# Patient Record
Sex: Female | Born: 1985 | Race: White | Hispanic: No | Smoking: Current every day smoker
Health system: Southern US, Community
[De-identification: ages and names within clinical notes are randomized; demographics above are authoritative.]

---
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
## 2004-02-18 ENCOUNTER — Inpatient Hospital Stay (HOSPITAL_COMMUNITY): Admission: AD | Admit: 2004-02-18 | Discharge: 2004-02-18 | Payer: Self-pay | Admitting: Obstetrics and Gynecology

## 2004-03-02 ENCOUNTER — Encounter (INDEPENDENT_AMBULATORY_CARE_PROVIDER_SITE_OTHER): Payer: Self-pay | Admitting: Specialist

## 2004-03-02 ENCOUNTER — Inpatient Hospital Stay (HOSPITAL_COMMUNITY): Admission: RE | Admit: 2004-03-02 | Discharge: 2004-03-05 | Payer: Self-pay | Admitting: Obstetrics and Gynecology

## 2004-04-09 ENCOUNTER — Other Ambulatory Visit: Admission: RE | Admit: 2004-04-09 | Discharge: 2004-04-09 | Payer: Self-pay | Admitting: Obstetrics and Gynecology

## 2005-08-02 ENCOUNTER — Other Ambulatory Visit: Admission: RE | Admit: 2005-08-02 | Discharge: 2005-08-02 | Payer: Self-pay | Admitting: Obstetrics and Gynecology

## 2005-11-21 ENCOUNTER — Inpatient Hospital Stay (HOSPITAL_COMMUNITY): Admission: AD | Admit: 2005-11-21 | Discharge: 2005-11-24 | Payer: Self-pay | Admitting: Obstetrics and Gynecology

## 2006-01-02 IMAGING — US US OB COMP +14 WK
1 series · 13 of 27 positions shown · non-contrast
Comparison: none

CLINICAL DATA: 37 week 4 day assigned gestational age by prior office ultrasound.  Breech presentation.  Reevaluate prior to version.  Evaluate fetal growth and amniotic fluid.

[Series 1: unknown · 0.35mm/px · 13 of 27 slices shown]
[im 2/27]
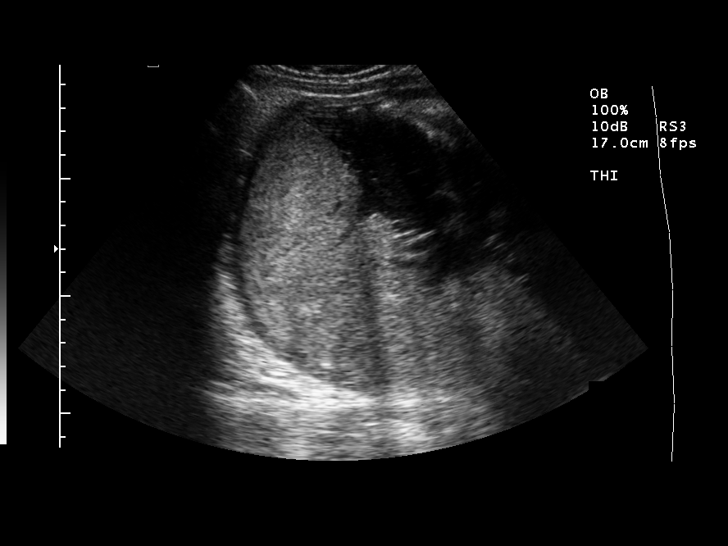
[im 4/27]
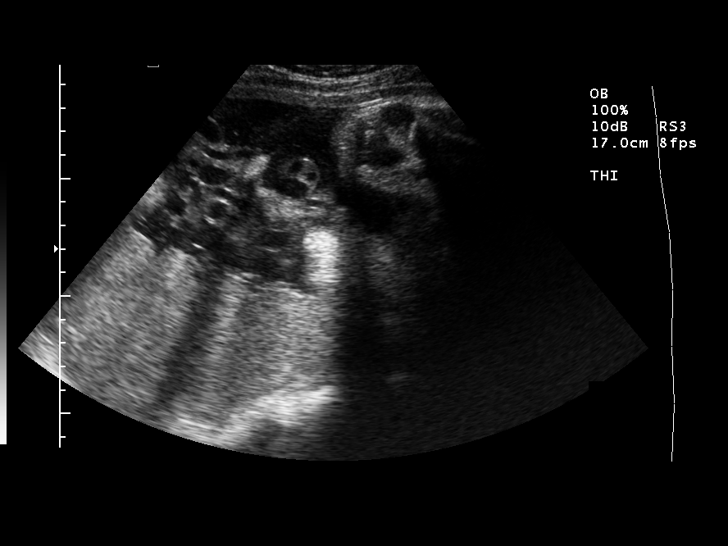
[im 6/27]
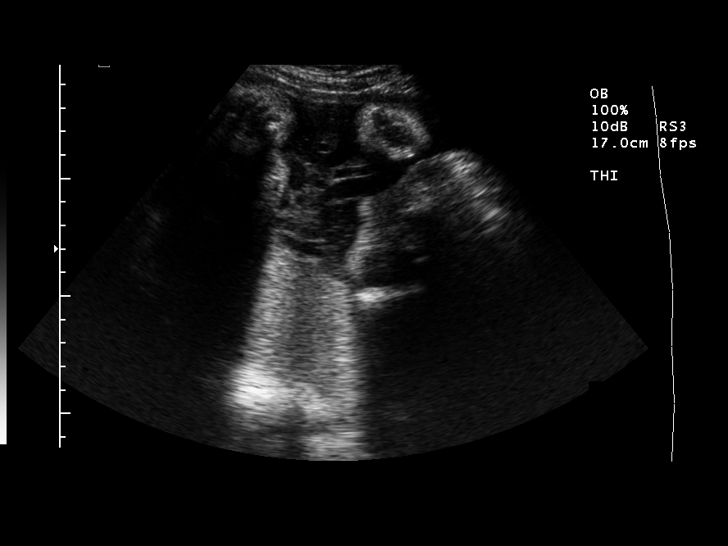
[im 8/27]
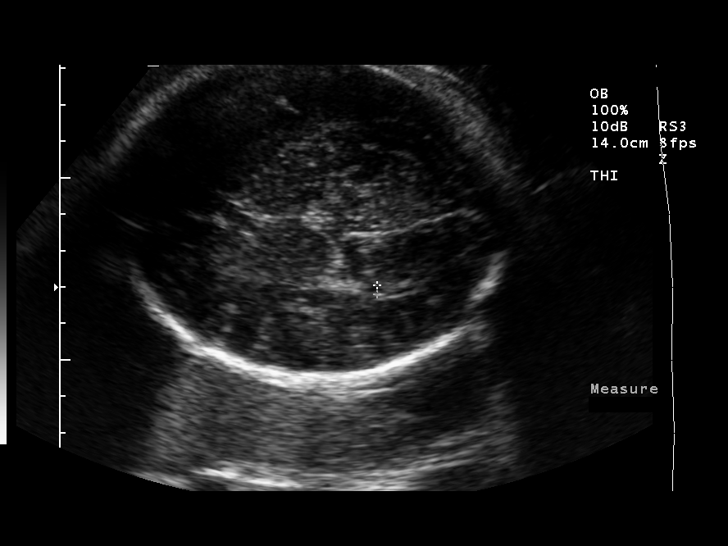
[im 10/27]
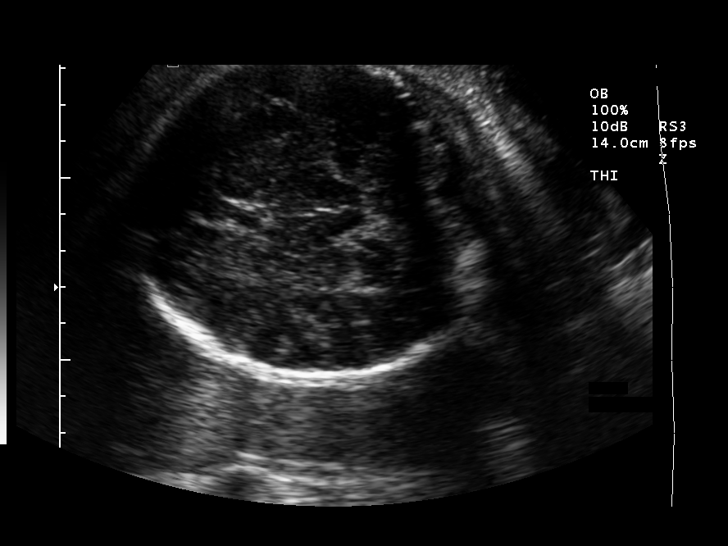
[im 12/27]
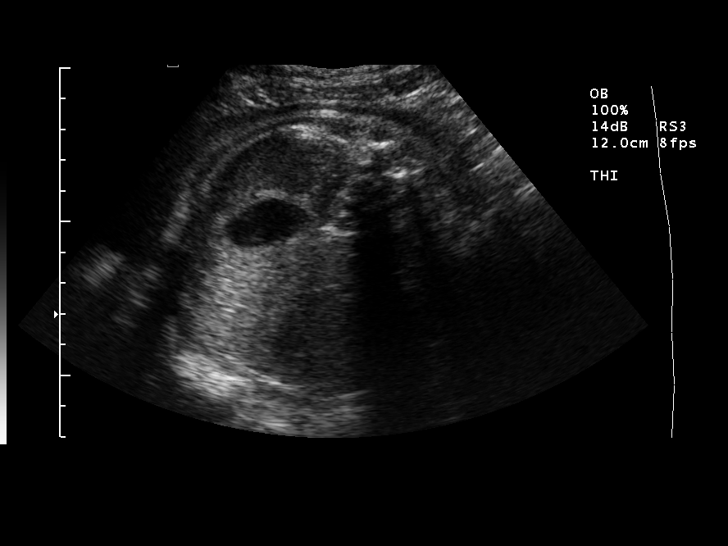
[im 14/27]
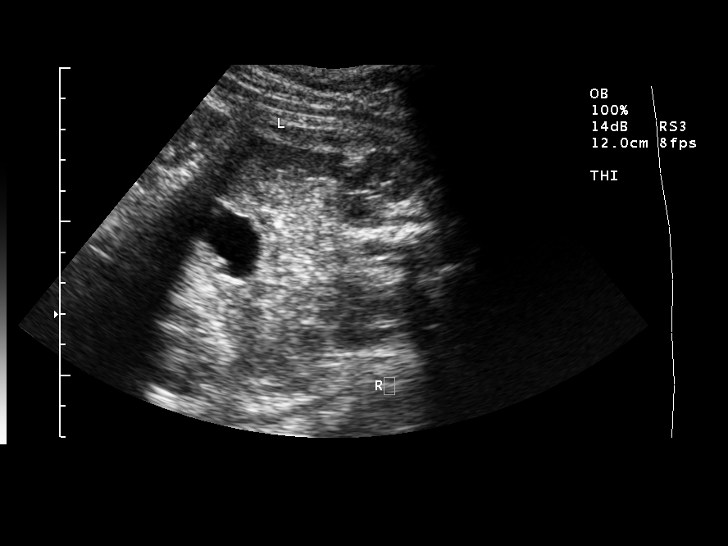
[im 16/27]
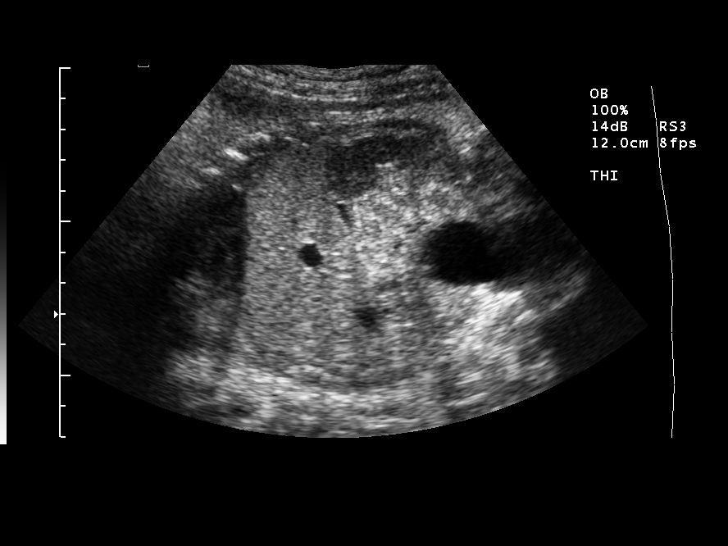
[im 18/27]
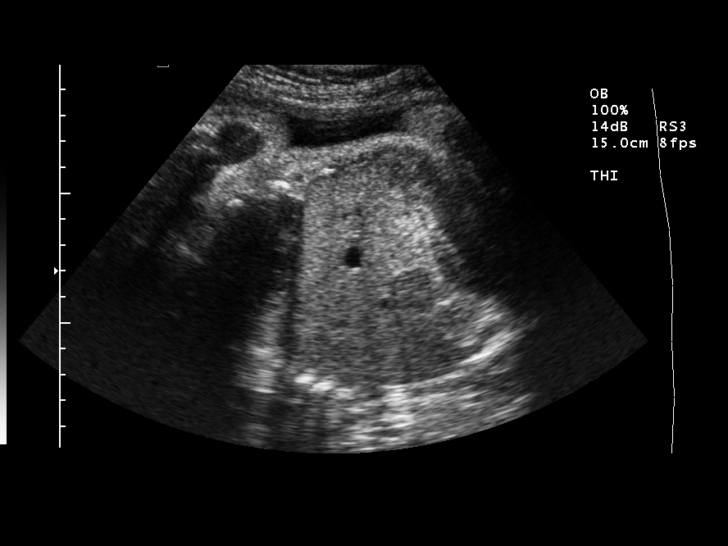
[im 20/27]
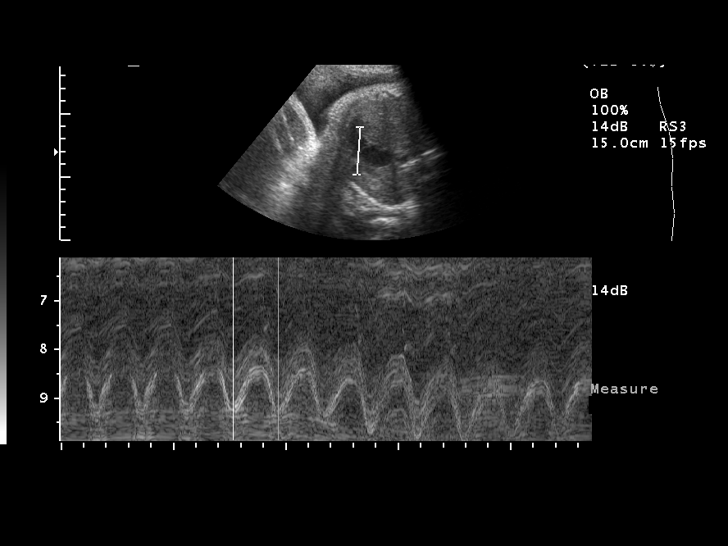
[im 22/27]
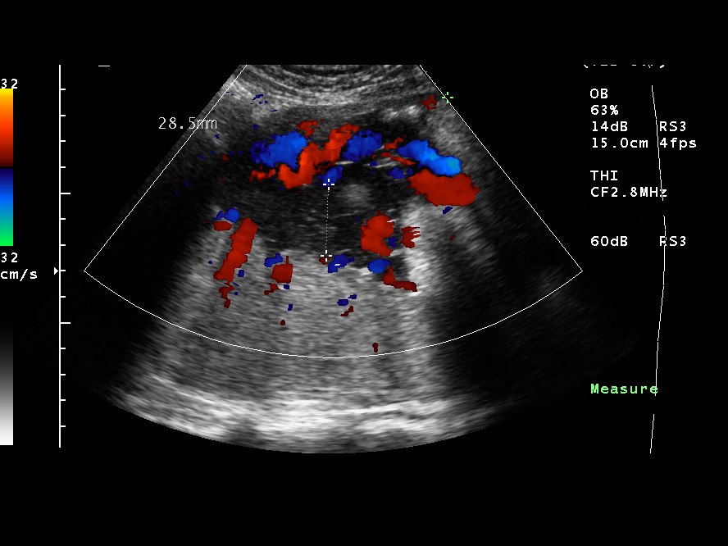
[im 24/27]
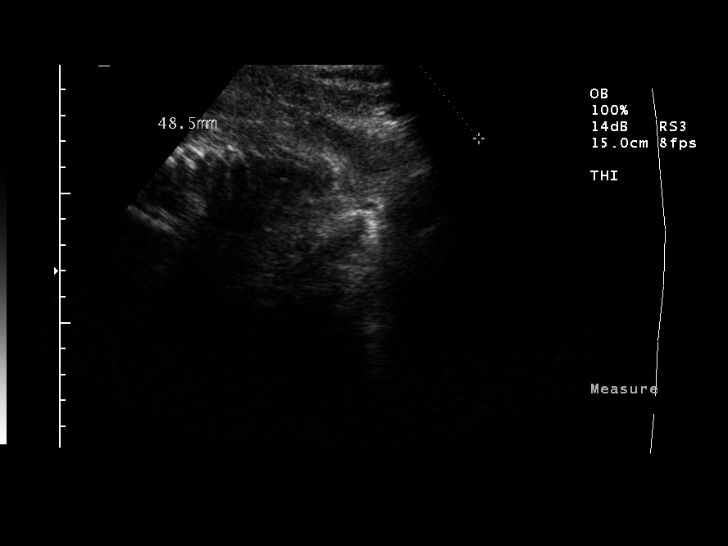
[im 26/27]
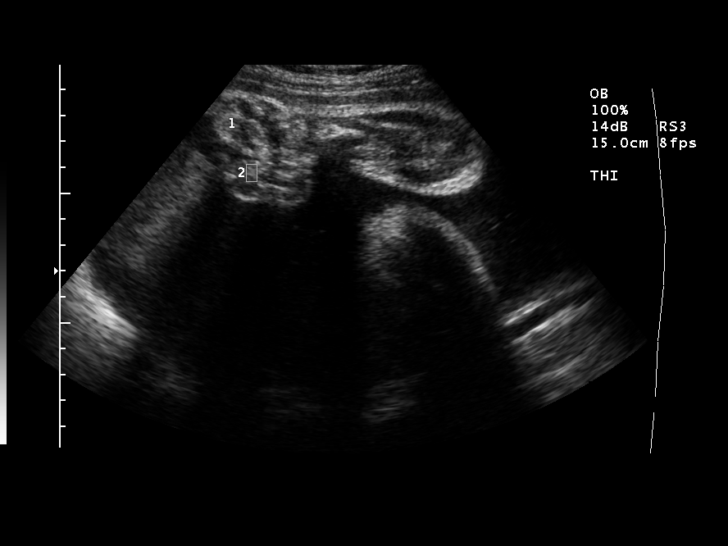

[13 of 27 positions shown; findings below may reference images not displayed]

OBSTETRICAL ULTRASOUND
 Number of Fetuses:  1  
 Heart Rate:  150
 Movement:  Yes
 Breathing:    No  
 Presentation:  Frank breech
 Placental Location:  Posterior
 Grade:  I
 Previa:  No
 Amniotic Fluid (Subjective):  Normal
 Amniotic Fluid (Objective):   13.4 cm AFI (5th -95th%ile =   7.3 ? 23.9 cm for 38 wks)

 FETAL BIOMETRY
 BPD:   8.9 cm   36 w 0 d
 HC:   33.4 cm   38 w 1 d
 AC:  34.1 cm   38 w 0 d
 FL:   7.3 cm   37 w 2 d

 MEAN GA:  37 w 3 d

 EFW:  2534 g (H) 50th ? 75th%ile (6846 ? 7093 g) For 38 wks

 FETAL ANATOMY
 Lateral Ventricles:    Visualized 
 Thalami/CSP:      Visualized 
 Posterior Fossa:  Not visualized 
 Nuchal Region:    N/A
 Spine:      Not visualized 
 4 Chamber Heart on Left:      Not visualized 
 Stomach on Left:      Visualized 
 3 Vessel Cord:    Visualized 
 Cord Insertion site:    Not visualized 
 Kidneys:  Visualized 
 Bladder:  Visualized 
 Extremities:      Not visualized 

 ADDITIONAL ANATOMY VISUALIZED:  Diaphragm

 Evaluation limited by:  Advanced gestational age and fetal position

 MATERNAL FINDINGS
 Cervix:   Not evaluated.  (> 37 wks)
IMPRESSION: Assigned gestational age is currently 37 weeks 4 days.  Appropriate fetal growth, with EFW at 50th ? 75th percentile.
 Normal amniotic fluid volume.  
 Fetus in frank breech presentation.  

 </u12:p>

## 2006-02-03 ENCOUNTER — Inpatient Hospital Stay (HOSPITAL_COMMUNITY): Admission: AD | Admit: 2006-02-03 | Discharge: 2006-02-06 | Payer: Self-pay | Admitting: Obstetrics and Gynecology

## 2006-02-11 ENCOUNTER — Inpatient Hospital Stay (HOSPITAL_COMMUNITY): Admission: AD | Admit: 2006-02-11 | Discharge: 2006-02-11 | Payer: Self-pay | Admitting: Obstetrics and Gynecology

## 2006-03-17 ENCOUNTER — Other Ambulatory Visit: Admission: RE | Admit: 2006-03-17 | Discharge: 2006-03-17 | Payer: Self-pay | Admitting: Obstetrics and Gynecology

## 2007-07-17 ENCOUNTER — Inpatient Hospital Stay (HOSPITAL_COMMUNITY): Admission: RE | Admit: 2007-07-17 | Discharge: 2007-07-20 | Payer: Self-pay | Admitting: Obstetrics and Gynecology

## 2007-07-17 ENCOUNTER — Encounter (INDEPENDENT_AMBULATORY_CARE_PROVIDER_SITE_OTHER): Payer: Self-pay | Admitting: Obstetrics and Gynecology

## 2010-09-05 ENCOUNTER — Encounter: Payer: Self-pay | Admitting: Obstetrics and Gynecology

## 2010-12-28 NOTE — Op Note (Signed)
NAMEBREEZIE, [name] NO.:  [id number]   MEDICAL RECORD NO.:  [id number]          PATIENT TYPE:  INP   LOCATION:  9128                          FACILITY:  [facility]   PHYSICIAN:  Osborn Coho, M.D.   DATE OF BIRTH:  11-07-1985   DATE OF PROCEDURE:  07/18/2007  DATE OF DISCHARGE:                               OPERATIVE REPORT   PREOPERATIVE DIAGNOSES:  1. Term intrauterine pregnancy.  2. Repeat C-section.   POSTOPERATIVE DIAGNOSES:  1. Term intrauterine pregnancy.  2. Repeat C-section.  3. Breech presentation.   PROCEDURE:  Repeat C-section.   ANESTHESIA:  Spinal.   ATTENDING:  Osborn Coho, M.D.   ASSISTANT:  Larna Daughters, CNM.   FLUIDS:  2000 mL.   ESTIMATED BLOOD LOSS:  650 mL.   Live female infant with Apgars 8 at one minute and 9 at five minutes,  normal-appearing bilateral ovaries and fallopian tubes.  Placenta to  pathology.   COMPLICATIONS:  None.   PROCEDURE:  The patient was taken to the operating room after risks,  benefits and alternatives reviewed with the patient.  The patient  verbalized understanding consent signed and witnessed.  The patient was  given a spinal per anesthesia and prepped and draped in normal sterile  fashion.  The Pfannenstiel skin incision was made at the site of prior  scar and carried down to the underlying layer of fascia with the scalpel  and Bovie.  The fascia was excised bilaterally in the midline extended  bilaterally with the Mayo scissors.  Kocher clamps were placed on the  inferior aspect of fascial incision and the rectus muscle excised from  the fascia.  The same was done on the superior aspect of fascial  incision.  The muscle separated in midline and the peritoneum entered  bluntly and extended manually.  Bladder blade was placed and bladder  flap created with the Metzenbaum scissors.  The lower uterine segment  was noted to be thin and uterine incision was made with the scalpel and  extended bilaterally with the bandage scissors.  Infant was in breech  presentation and was delivered via breech extraction.  The cord clamped  and cut and the infant handed to waiting pediatricians.  The placenta  was removed via fundal massage.  The uterus cleared of all clots and  debris.  The incision was repaired with 0 Vicryl in a running locked  fashion and a second imbricating layer was performed.  Bilateral ovaries  and fallopian tubes appeared to be within normal limits.  Intra-  abdominal cavity was copiously irrigated.  The uterine incision was  noted to be hemostatic.  The bladder flap was repaired with 3-0 Vicryl  via a running stitch.  The peritoneum was repaired with 2-0 chromic via  a running stitch.  The fascia was repaired with 0 Vicryl in a running  fashion.  The subcutaneous tissue was irrigated and made hemostatic with  the Bovie.  The subcutaneous tissue was reapproximated using three  interrupted stitches of 2-0 plain.  The skin was reapproximated using 3-  0 Monocryl via a  subcuticular stitch.  Half inch Steri-Strips were  applied with Benzoin.  Sponge, lap and needle count was correct.  The  patient tolerated procedure well and was returned to recovery room in  good condition.      Osborn Coho, M.D.  Electronically Signed     AR/MEDQ  D:  07/18/2007  T:  07/18/2007  Job:  [id number]

## 2010-12-28 NOTE — Discharge Summary (Signed)
[name], [name]                ACCOUNT NO.:  [id number]   MEDICAL RECORD NO.:  [id number]          PATIENT TYPE:  INP   LOCATION:  9128                          [facility]  [facility]   PHYSICIAN:  Naima A. Dillard, M.D. DATE OF BIRTH:  [birth year]   DATE OF ADMISSION:  07/17/2007  DATE OF DISCHARGE:  07/20/2007                               DISCHARGE SUMMARY   ADMISSION DIAGNOSES:  1. Intrauterine pregnancy at term.  2. History of previous cesarean section x2.  3. Group beta strep positive.  4. History of herpes simplex virus 1.  5. Desires repeat cesarean section.   DISCHARGE DIAGNOSES:  1. Intrauterine pregnancy at term.  2. Status post cesarean delivery of a female infant named [name],      weighing 6 pounds 9 ounces, Apgars 8 and 9.  3. Breast feeding.  4. Depo-Provera injection postpartum on July 20, 2007.  5. Plan for Mirena IUD insertion at approximately 3 months postpartum.   PROCEDURE:  1. Repeat low transverse cesarean section.  2. Spinal anesthesia.   HOSPITAL COURSE:  The patient was admitted on July 17, 2007 for a  repeat low transverse cesarean section.  The C-section was performed by  Dr. Osborn Coho, and findings were a viable female infant named  [name], weighing 6 pounds and 9 ounces, with Apgars of 8 and 9.  The  patient will be nicknamed [name] for short.  The patient was sent to  recovery in good condition.  The newborn female was sent to the full-term  nursery.   On postoperative day #1, the patient was doing well, breast feeding, and  vital signs were stable.  Her hemoglobin was 10.5, down from 12.8 on  admission.  Platelets were stable at 173, down from 217 on admission.  White blood cell count was 7.7, up from 5.7 on day of admission.  Discussed with Dr. Pennie Rushing at that time desire to have Mirena IUD  insertion, and plan was made to administer Depo-Provera injection on  date of discharge in awaiting Mirena insertion to be placed at 3 months  postpartum.  The patient was tolerating a regular diet that day.  Her  incision was intact with small amount of old dry drainage.   On postoperative day #2, the patient was voiding without difficulty,  with normal appetite.  No bowel movement; however, was passing gas.  The  patient and father of the baby declined circumcision for newborn female.  The patient was up and showering the night before, and pain was well-  controlled with Motrin and as needed Percocet.  Vital signs remained  stable.  The patient continued to receive routine postoperative and  postpartum care.   On postoperative day #3, the patient was doing well, ready to go home.  Reported breast feeding was still doing well, and the patient continued  to verbalize desire to have Depo-Provera injection prior to discharge  and plan for IUD insertion at approximately 3 months postpartum.  The  patient was up ad lib.   PHYSICAL EXAMINATION:  VITAL SIGNS:  Stable.  LUNGS:  Clear.  CARDIOVASCULAR:  Regular rate and rhythm.  BREASTS:  Soft and nontender with no cracking or blisters.  PELVIC:  Lochia was scant.  Her fundus was firm at -1.  Her Steri-Strips  were intact on her incision.  There was old dry dark red drainage.  EXTREMITIES:  Mild nonpitting edema in bilateral lower extremities, and  she has negative clonus and negative Homan's sign.  ABDOMEN:  Appropriate tender and soft on palpation.   The patient did receive Depo-Provera shot 150 mg injection prior to  discharge home.  She was deemed to have received full benefit of her  hospital stay and was discharged home in good condition with newborn  female.   DISCHARGE INSTRUCTIONS:  Per [facility] handout.   FOLLOW UP:  Discharge followup in 6 weeks or p.r.n.   DISCHARGE MEDICATIONS:  1. Motrin 600 mg p.o. q.6h. p.r.n. pain.  2. Tylox 1-2 tablets p.o. q.4-6h. p.r.n. pain.  3. The patient was to continue taking prenatal vitamins while breast      feeding.      Candice  Ovando, CNM      Naima A. Normand Sloop, M.D.  Electronically Signed    CHS/MEDQ  D:  07/20/2007  T:  07/20/2007  Job:  [id number]
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
## 2010-12-31 NOTE — Discharge Summary (Signed)
[name], [name] NO.:  [id number]   MEDICAL RECORD NO.:  [id number]          PATIENT TYPE:  INP   LOCATION:  9113                          [facility]   PHYSICIAN:  Osborn Coho, M.D.   DATE OF BIRTH:  1986/08/09   DATE OF ADMISSION:  02/03/2006  DATE OF DISCHARGE:                                 DISCHARGE SUMMARY   ADMISSION DIAGNOSES:  1.  Intrauterine pregnancy at term.  2.  Breech presentation.  3.  Previous cesarean section, desires repeat.   DISCHARGE DIAGNOSES:  1.  Intrauterine pregnancy at term.  2.  Breech presentation.  3.  Previous cesarean section, desires repeat.  4.  Status post cesarean delivery of a viable female infant weighing 6      pounds, 11 ounces, Apgars 9 and 9.   HOSPITAL PROCEDURES:  1.  Spinal anesthesia.  2.  Elective repeat cesarean section, low transverse.   HOSPITAL COURSE:  The patient was admitted for an elective repeat cesarean  section which was performed under spinal anesthesia by Dr. Su Hilt without  complications.  EBL was 600 mL.  She was taken to Recovery and then to the  Mother-Baby Unit where she received routine postop care.  She did have some  dizziness on the first day but recovered from that same day.  Hemoglobin was  10.3 on postop day #1 and she was given routine care.  On postop day #2, she  continued to do well, had less dizziness with walking, was breastfeeding the  baby and pain was well controlled with pain medicine.   On postop day #3 patient was preparing to go home, she was breastfeeding  well.  CHEST:  Was clear,  HEART RATE:  Regular rate and rhythm.  ABDOMEN:  Soft and appropriately tender.  Incision was clean, dry and  intact.  Lochia was within normal limits.  EXTREMITIES:  Within normal limits.   She was deemed to have received full benefit of her hospital stay and was  discharged home.   DISCHARGE MEDICATIONS:  1.  Motrin 600 mg p.o. q.6h. p.r.n.  2.  Tylox 1-2 p.o. q.4h.  p.r.n.  3.  Prenatal vitamin one p.o. daily.   DISCHARGE LABS:  White blood cell count 9.9, hemoglobin 10.3, platelet count  176.  RPR nonreactive.   DISCHARGE INSTRUCTIONS:  __________ discharge follow up in 6 weeks or p.r.n.      Marie L. Williams, C.N.M.      Osborn Coho, M.D.  Electronically Signed    MLW/MEDQ  D:  02/06/2006  T:  02/06/2006  Job:  [id number]

## 2010-12-31 NOTE — Op Note (Signed)
NAMEDELORIA, BRASSFIELD NO.:  [id number]   MEDICAL RECORD NO.:  [id number]                   PATIENT TYPE:  INP   LOCATION:  9116                                 [facility]ILITY[facility]   PHYSICIAN:  Osborn Coho, M.D.                DATE OF BIRTH:  [birth year]   DATE OF PROCEDURE:  03/02/2004  DATE OF DISCHARGE:                                 OPERATIVE REPORT   PREOPERATIVE DIAGNOSES:  1. Term intrauterine pregnancy.  2. Breech presentation.   POSTOPERATIVE DIAGNOSES:  1. Term intrauterine pregnancy.  2. Breech presentation.   PROCEDURE:  Primary low transverse cesarean section via Pfannenstiel skin  incision.   ANESTHESIA:  Spinal.   ATTENDING PHYSICIAN:  Osborn Coho, M.D.   ASSISTANT:  Concha Pyo. Duplantis, C.N.M.   FLUIDS:  2700 mL.   URINE OUTPUT:  350 mL.   ESTIMATED BLOOD LOSS:  700 mL.   COMPLICATIONS:  None.   FINDINGS:  Live female infant with Apgars of 9 at 1 minute and 9 at 5  minutes Osie Bond), normal appearing bilateral ovaries and tubes.  Placenta to path.   DESCRIPTION OF PROCEDURE:  The patient was taken to the operating room after  the risks, benefits, and alternatives were discussed with the patient.  The  patient verbalized understanding and consent signed and witnessed. The  patient was given a spinal per anesthesia and prepped and draped in the  normal sterile fashion.  Approximately 15 mL of 1% lidocaine was then  injected at the site of the incision.  A Pfannenstiel skin incision was made  and carried down to the underlying layer of fascia with a knife.  The  fascial incision was extended bilaterally with the Mayo scissors. Kocher  clamps were placed on the superior aspect of the fascial incision and the  rectus muscle excised from the fascia. The same was done on the inferior  aspect of the fascial incision. The rectus muscle was separated in the  midline and the peritoneum entered bluntly. The peritoneum  was extended  manually and the bladder blade placed for retraction. The bladder flap was  created with Metzenbaum scissors.  The uterine incision was made with the  scalpel and extended bilaterally with the bandage scissors.  The infant was  delivered in breech presentation without difficulty.  The cord was clamped  and cut and the infant was handed to the waiting pediatricians after the  oropharynx and nasopharynx were bulb suctioned.  The uterus was cleared of  all clots and debris after the placenta was removed via fundal massage.  __________ was administered after delivery of the infant.  The uterine  incision was repaired with #0 Vicryl in a running locked fashion and a  second imbricating layer was performed.  The intraabdominal cavity was  copiously irrigated.  The adnexal findings were as mentioned above. The  bladder flap was repaired with  2-0 chromic in a running fashion. The  peritoneum was closed with 3-0 chromic in a running fashion.  Irrigation was  performed again. The fascia was repaired with #0 Vicryl in a running  fashion.  The subcutaneous tissue was irrigated and made hemostatic with the  Bovie.  The skin was closed with staples.  Sponge, lap and needle count was  correct.  The patient tolerated the procedure well and was returned to the  recovery room in good condition.                                               Osborn Coho, M.D.    AR/MEDQ  D:  03/02/2004  T:  03/03/2004  Job:  [id number]

## 2010-12-31 NOTE — Discharge Summary (Signed)
NAMEBLYTHE, [name]                            ACCOUNT NO.:  [id number]   MEDICAL RECORD NO.:  [id number]                   PATIENT TYPE:  INP   LOCATION:  9116                                 [facility]   PHYSICIAN:  Crist Fat. Rivard, M.D.              DATE OF BIRTH:  09-Jan-1986   DATE OF ADMISSION:  03/02/2004  DATE OF DISCHARGE:  03/05/2004                                 DISCHARGE SUMMARY   ADMITTING DIAGNOSES:  1. Intrauterine pregnancy at term.  2. Breech presentation.   DISCHARGE DIAGNOSES:  1. Intrauterine pregnancy at term.  2. Breech presentation.   PROCEDURES:  1. Primary low transverse cesarean section.  2. Spinal anesthesia.   HOSPITAL COURSE:  [name] is an 25 year old gravida 2 para 0-0-1-0 at 42  weeks who was admitted for primary low transverse cesarean section secondary  to breech presentation.  She had had an attempted version last week that was  unsuccessful.  Her pregnancy had been remarkable for:  1. Risk of UTIs.  2. History of HSV 1.  3. Late to care.  4. Adolescent.  5. Breech presentation identified at 33 weeks with failed external version 1     week ago.   The patient was taken to the operating room where a primary low transverse  cesarean section was performed by Dr. Osborn Coho.  Findings were a  viable female by the name of [name], weight 7 pounds 12 ounces, Apgars  were 9 and 9.  The patient was taken to the recovery room in good condition.  The infant was taken to the full-term nursery.  Placenta was sent to  pathology.  By postoperative day #1 the patient was doing well.  She was up  ad lib.  She was ambulating and passing flatus.  Her hemoglobin was 10.5,  down from 12.9.  She was breast feeding.  By the next day she continued to  do well.  She was undecided about contraception but had elected to decide at  6 weeks.  By postoperative day #3 she was doing well, she was up ad lib.  Her incision was clean, dry, and intact with  staples.  Those were removed on  postoperative day #3.  Her lochia was scant.  Her baby was doing well,  breast feeding was going well.  She was deemed to have received the full  benefit of her hospital stay and was discharged home.   DISCHARGE INSTRUCTIONS:  Per [facility] handout.   DISCHARGE MEDICATIONS:  1. Motrin 600 mg p.o. q.6h. p.r.n. pain.  2. Tylox one to two p.o. q.3-4h. p.r.n. pain.   Discharge follow-up will occur in 6 weeks at [facility].     Renaldo Reel Emilee Hero, C.N.M.                   Crist Fat Rivard, M.D.    VLL/MEDQ  D:  03/05/2004  T:  03/06/2004  Job:  [id number]

## 2010-12-31 NOTE — H&P (Signed)
NAMEBRITTANIA, [name] NO.:  [id number]   MEDICAL RECORD NO.:  [id number]          PATIENT TYPE:  INP   LOCATION:  9199                          [facility]ILITY:  [facility]   PHYSICIAN:  Osborn Coho, M.D.   DATE OF BIRTH:  17-May-1986   DATE OF ADMISSION:  02/03/2006  DATE OF DISCHARGE:                                HISTORY & PHYSICAL   HISTORY OF PRESENT ILLNESS:  This is a 25 year old, G3, P1-0-1-1 at 39-17  weeks who presents for repeat cesarean section.  She had been planning a  repeat cesarean section, but also was recently found to be breech.  Pregnancy has been followed by the nurse midwife service and remarkable for  history of previous C-section, adolescent, HSV-1, placenta previa now  resolved and Group B Streptococcus positive.   ALLERGIES:  No known drug allergies.   PAST OBSTETRICAL HISTORY:  Spontaneous abortion in 2002, at 9 weeks'  gestation.  She had a primary low transverse cesarean section in 2005, of a  female infant at 73 weeks' gestation weighing 7 pounds 12 ounces for breech  presentation.   PAST MEDICAL HISTORY:  1.  History of HSV-1.  2.  Childhood varicella.  3.  Irritable bowel syndrome several years ago.   PAST SURGICAL HISTORY:  1.  Tonsillectomy/adenoidectomy with tubes in her ears.  2.  C-section x1.   FAMILY HISTORY:  Remarkable for mother, grandmother and grandfather with  heart disease.  Grandmother with phlebitis.  Uncle with diabetes.  Mother  with thyroid dysfunction.  Grandmother with renal disease and prostate  cancer.  Grandmother with lung cancer.   GENETIC HISTORY:  Remarkable for father of the baby with sickle cell trait  and the patient's daughter with sickle cell trait.   SOCIAL HISTORY:  The patient is single.  Father of the baby, Kerby Moors,  questionably involved and supportive.  He is not with her today.  She  attends the [employer].  She denies any alcohol, tobacco or drug  use.   PRENATAL  LABORATORY DATA:  Hemoglobin 12.2, platelets 200, blood type AB  positive, antibody screen negative, RPR nonreactive.  Rubella immune.  Hepatitis negative.  HIV negative.  Pap smear normal.  Gonorrhea negative.  Chlamydia negative.  Cystic fibrosis negative.   PRENATAL COURSE:  The patient entered care at 14 weeks' gestation.  She  initially expressed a desire to attempt a trial of labor, but later decided  to have a repeat C-section.  She had an ultrasound at 18 weeks which was  normal except for a partial previa which later resolved by 20 weeks.  She  had a Glucola at 26 weeks which was normal.  She had pyelonephritis at 28  weeks treated successfully with antibiotics.  She had another ultrasound at  33 weeks showing breech presentation and 25th percentile growth.  She was  Group B Streptococcus positive at term.   PHYSICAL EXAMINATION:  VITAL SIGNS:  Stable.  Afebrile.  HEENT:  Within normal limits.  NECK:  Thyroid not enlarged.  CHEST:  Clear to auscultation.  HEART:  Regular rate and rhythm.  ABDOMEN:  Gravid.  Breech to [place].  Fetal heart tones 150s.  PELVIC:  Deferred.  EXTREMITIES:  Within normal limits.   ASSESSMENT:  1.  Intrauterine pregnancy at 39-1/7 weeks.  2.  Breech presentation.  3.  Previous cesarean section.  4.  Desires repeat cesarean section.   PLAN:  1.  Admit to operating suites per Dr. Su Hilt.  2.  Routine OR orders.      Marie L. Williams, C.N.M.      Osborn Coho, M.D.  Electronically Signed    MLW/MEDQ  D:  02/03/2006  T:  02/03/2006  Job:  [id number]
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
## 2010-12-31 NOTE — H&P (Signed)
[name], [name]                ACCOUNT NO.:  [id number]   MEDICAL RECORD NO.:  [id number]          PATIENT TYPE:  INP   LOCATION:  9157                          [facility]   PHYSICIAN:  Crist Fat. Rivard, M.D. DATE OF BIRTH:  Sep 26, 1985   DATE OF ADMISSION:  11/21/2005  DATE OF DISCHARGE:                                HISTORY & PHYSICAL   HISTORY OF PRESENT ILLNESS:  [name] is a 25 year old gravida 3, para 1-0-  1-1 who was admitted at 28 weeks and 4 days gestation with probable  pyelonephritis.  Patient with history of chills since Friday evening.  Patient reports that she noted a fever of 101.7 on Sunday evening and  reports that she has had fever and chills since Friday.  Patient also  reports onset of nausea as well over the past 2-3 days however, she denies  vomiting or diarrhea.  Patient denies dysuria, frequency and urgency  however, she does complain of left flank pain.  Patient reports her fetus is  moving but not as much as normal.  Patient denies uterine contractions,  leakage of fluid or bleeding.  Patient denies other somatic symptoms such as  cough, sore throat or headache at the present time.  Patient's pregnancy  remarkable for (1) previous cesarean section due to breech presentation,  patient desires repeat C-section, (2) adolescent pregnancy, (3) history of  HSV-1, (4) posterior partial previa which resolved.  Patient with no  previous history of pyelonephritis in the past.  Patient does have a history  of frequent UTIs in the past.   PRENATAL LABORATORIES:  Initial hemoglobin 12.2, platelets 200,000, blood  type AB positive, antibody screen negative, RPR nonreactive, rubella titer  immune, hepatitis B surface antigen negative, HIV nonreactive.  Pap smear  within normal limits.  Gonorrhea and Chlamydia cultures negative.  Cystic  fibrosis screening negative.  Quad screen is negative.   HISTORY OF PRESENT PREGNANCY:  Patient entered care at approximately  [redacted]  weeks gestation.  Patient's pregnancy has been followed by the CNM Service  of [address].  Patient with LMP April 27, 2005 giving [facility]  of January 31, 2006.  Initial ultrasound done at [redacted] weeks gestation which  revealed posterior partial previa.  Ultrasound repeated at [redacted] weeks  gestation based on LMP to reevaluate placenta and completion of anatomy  revealed previa resolved.  Patient with no complaints of bleeding.  The  remainder of patient's prenatal course has been unremarkable until November 18, 2005.  Patient is scheduled for repeat cesarean section February 03, 2006.   OBSTETRICAL HISTORY:  Pregnancy #13 October 2000 spontaneous AB at 8-9 weeks,  no D&C, no complications.  July 2005 primary low transverse cesarean section  at 39-3/[redacted] weeks gestation secondary to breech presentation, viable female  infant 7 pounds 12 ounces, no complications.  Pregnancy #3 is current.   MEDICAL HISTORY:  History of frequent UTIs and previous hospitalization for  psychiatric issues at age 69 however none since and no medications.   SURGICAL HISTORY:  Tonsillectomy, bilateral myringotomy and cesarean section  otherwise negative.  ALLERGIES:  NO KNOWN DRUG ALLERGIES.  MILK CAUSES STOMACH UPSET.   CURRENT MEDICATIONS:  Prenatal vitamins.   FAMILY HISTORY:  Mother with heart disease.  Paternal grandfather heart  disease.  Maternal grandmother heart disease and phlebitis.  Maternal  grandfather heart disease.  Mother with thyroid disorder.   GENETIC HISTORY:  Caitlin, father of the baby, with sickle cell trait.  Patient's daughter [name] with positive sickle cell trait.   SOCIAL HISTORY:  Patient is single.  Father of the baby [name] is  involved and supportive.  Patient has 11 years education and works at  [employer].  Father of the baby with 12 years of education and works in  grocery.  Patient and the father of the baby are [name] in their faith  and attend [employer].    PHYSICAL EXAMINATION:  VITAL SIGNS:  Temperature 99.8 degrees.  Vital signs  are stable.  GENERAL:  Patient is ill-appearing white female in no apparent distress.  Color pale.  Skin warm and dry.  HEENT:  Within normal limits.  LUNGS:  Clear.  HEART:  Regular rate and rhythm.  BREASTS:  Soft.  ABDOMEN:  Soft, gravid and nontender with fundal height extending 28 cm  above the symphysis pubis.  Left CVA tenderness is noted to be present.  PELVIC:  Exam is deferred.  EXTREMITIES:  No edema noted.  Deep tendon reflexes are 1+.  No clonus.  Negative Homans' sign.   LABORATORY:  Urinalysis reveals +2 leukocytes, positive nitrites, trace of  protein, +3 ketones and trace of blood.   ASSESSMENT:  1.  Intrauterine pregnancy 28 and 4/[redacted] weeks gestation.  2.  Pyelonephritis.   PLAN:  Consult obtained with Dr. Estanislado Pandy.  Patient to be admitted to  antepartum at [facility] for IV hydration and IV antibiotics  and further observation.  Orders per Dr. Estanislado Pandy.      Rhona Leavens, CNM      Crist Fat Rivard, M.D.  Electronically Signed    NOS/MEDQ  D:  11/21/2005  T:  11/21/2005  Job:  [id number]

## 2010-12-31 NOTE — Op Note (Signed)
[name], [name] NO.:  [id number]   MEDICAL RECORD NO.:  [id number]          PATIENT TYPE:  INP   LOCATION:                                [facility]   PHYSICIAN:  Osborn Coho, M.D.   DATE OF BIRTH:  [birth year]   DATE OF PROCEDURE:  02/03/2006  DATE OF DISCHARGE:  02/06/2006                                 OPERATIVE REPORT   PREOPERATIVE DIAGNOSES:  1.  Term intrauterine pregnancy.  2.  Breech presentation.  3.  Repeat cesarean section.   POSTOPERATIVE DIAGNOSES:  1.  Term intrauterine pregnancy.  2.  Breech presentation.  3.  Repeat cesarean section.   PROCEDURE:  Repeat cesarean section.   ANESTHESIA:  Spinal.   SURGEON:  Osborn Coho, M.D.   ASSISTANT:  Elby Showers. Williams, C.N.M.   FLUIDS:  2450 mL.   URINE OUTPUT:  452 mL.   ESTIMATED BLOOD LOSS:  600 mL.   COMPLICATIONS:  None.   PATHOLOGY:  Placenta to pathology.   FINDINGS:  A live female infant with Apgars of 9 at one minute and 9 at five  minutes.   DESCRIPTION OF PROCEDURE:  The patient was taken to the operating room after  the risks, benefits and alternatives were reviewed with the patient.  The  patient verbalized understanding and a consent was signed and witnessed.  The patient was given a spinal per anesthesia and prepped and draped in a  normal sterile fashion.  A Pfannenstiel skin incision was made and carried  down to the underlying layer of fascia with the Bovie.  The fascia was  excised bilaterally in the midline with the Bovie and extended bilaterally  with the Mayo scissors.  Kocher clamps were placed on the inferior aspect of  the fascial incision and the rectus muscle excised from the fascia.  The  same was done on the superior aspect of the fascial incision.  The muscle  was separated in the midline and the peritoneum entered bluntly.  The lower  uterine segment was noted to be particularly thin with even a small window,  after a bladder flap was  created.  The uterine incision was made and  extended bilaterally with the bandage scissors.  The infant was delivered in  the frank breech presentation without difficulty and the cord was clamped  and cut and the patient administered 1 gram of Ancef.  The infant was handed  to the waiting pediatricians after the oropharynx and nasopharynx were bulb  suctioned.  Cord bloods were collected and the placenta was delivered  manually.  The uterus was cleared of all clots and debris.  The uterine  incision was repaired with #0 Vicryl in a running lock fashion and a second  imbricating layer was performed.  The intra-abdominal cavity was copiously  irrigated.  The uterine incision was noted to be hemostatic.  The peritoneum  was repaired with #2-0 chromic in a running fashion.  The fascia was  repaired with #0 Vicryl in a running fashion.  The subcutaneous tissue was  irrigated and made hemostatic with the  Bovie.  The subcutaneous tissue was  reapproximated using #2-0 plain.  The skin was reapproximated using #3-0  Monocryl.  The incision was dressed.  The sponge, lap and needle counts were  correct.   The patient tolerated the procedure well and was returned to the recovery  room in good condition.      Osborn Coho, M.D.  Electronically Signed     AR/MEDQ  D:  02/03/2006  T:  02/03/2006  Job:  [id number]

## 2010-12-31 NOTE — Discharge Summary (Signed)
NAMEJULYANNA, [name] NO.:  [id number]   MEDICAL RECORD NO.:  [id number]          PATIENT TYPE:  INP   LOCATION:  9157                          [facility]   PHYSICIAN:  Janine Limbo, M.D.DATE OF BIRTH:  1985-10-23   DATE OF ADMISSION:  11/21/2005  DATE OF DISCHARGE:  11/24/2005                                 DISCHARGE SUMMARY   ADMITTING DIAGNOSES:  1.  Intrauterine pregnancy at 28-4/[redacted] weeks gestation.  2.  Pyelonephritis.   DISCHARGE DIAGNOSES:  1.  Intrauterine pregnancy at 29 weeks.  2.  Pyelonephritis, resolving.  3.  Vulvovaginal Candidiasis.   PROCEDURE:  IV antibiotics and hydration.   HOSPITAL COURSE:  [name] is a 25 year old gravida 3, para 1-0-1-1, who  was admitted at 28 weeks and 4 days gestation with fever, flank pain and  positive urinalysis with diagnosis of pyelonephritis.  Patient with onset of  symptoms on 11/18/2005.   Patient's pregnancy remarkable for:  1.  Previous cesarean section.  2.  Adolescent pregnancy.  3.  History of HSV type 1.  4.  History of frequent UTIs.  5.  Posterior partial placenta previa, resolved.   Patient received IV hydration and antibiotics from the time of admission.  The patient's last fever occurred 11/22/2005.  Patient with continued flank  pain until 11/24/2005.  On the date of discharge patient was feeling much  better.  Patient was noted to have uterine contractions during fetal  monitoring during her course of hospitalization.  Fetal fibronectin,  obtained prior to discharge, returned with negative results.  Patient also  with onset of external genitalia swelling and increased vaginal discharge.  Wet prep done prior to discharge revealed vulvovaginal candidiasis.  Patient  afebrile for greater than 24 hours at the time of discharge as well as  tolerating liquids and solids without difficulty.  Patient's fetus remained  reassuring and reactive on fetal monitoring during the course of her  stay.  Patient was discharged home.   DISCHARGE CONDITION:  Stable.   DISCHARGE INSTRUCTIONS:  Patient to call for any signs and symptoms of pre-  term labor or recurrent fever, back pain.  Patient will be out of work until  Monday, November 28, 2005, when she returns for followup.  The patient's  activities are otherwise within normal limits.  Patient was given complete  pre-term labor instructions.   DISCHARGE MEDICATIONS:  1.  Macrobid one capsule every 12 hours for the next 2 weeks.  2.  Terazol 7 one applicator per vagina nightly x7.  3.  Prenatal vitamins one tab daily.   DISCHARGE FOLLOWUP:  Will occur on Monday, November 28, 2005, at 9:45 a.m. at  [facility].      Rhona Leavens, CNM      Janine Limbo, M.D.  Electronically Signed    NOS/MEDQ  D:  11/24/2005  T:  11/24/2005  Job:  [id number]

## 2011-05-23 LAB — CBC
Hemoglobin: 12.8
MCV: 94.5
RBC: 3.18 — ABNORMAL LOW
RDW: 13
WBC: 7.7

## 2011-05-23 LAB — RPR: RPR Ser Ql: NONREACTIVE

## 2016-11-01 ENCOUNTER — Encounter: Payer: Self-pay | Admitting: Obstetrics & Gynecology

## 2016-11-01 ENCOUNTER — Other Ambulatory Visit (HOSPITAL_COMMUNITY)
Admission: RE | Admit: 2016-11-01 | Discharge: 2016-11-01 | Disposition: A | Discharge status: Home / Self Care | Payer: Medicaid Other | Source: Ambulatory Visit | Attending: Obstetrics & Gynecology | Admitting: Obstetrics & Gynecology

## 2016-11-01 ENCOUNTER — Ambulatory Visit (INDEPENDENT_AMBULATORY_CARE_PROVIDER_SITE_OTHER): Payer: Medicaid Other | Admitting: Obstetrics & Gynecology

## 2016-11-01 VITALS — BP 124/77 | HR 104 | Ht 63.0 in | Wt 138.0 lb

## 2016-11-01 DIAGNOSIS — Z0000 Encounter for general adult medical examination without abnormal findings: Secondary | ICD-10-CM | POA: Diagnosis not present

## 2016-11-01 DIAGNOSIS — Z30017 Encounter for initial prescription of implantable subdermal contraceptive: Secondary | ICD-10-CM

## 2016-11-01 DIAGNOSIS — Z309 Encounter for contraceptive management, unspecified: Secondary | ICD-10-CM | POA: Diagnosis not present

## 2016-11-01 DIAGNOSIS — Z01419 Encounter for gynecological examination (general) (routine) without abnormal findings: Secondary | ICD-10-CM | POA: Diagnosis not present

## 2016-11-01 NOTE — Progress Notes (Signed)
   Subjective:    Patient ID: [name], female    DOB: 1985-12-30, 31 y.o.   MRN: [id number]  HPI 31 yo DW P3 (9, 10, and 212 yo kids) here today to discuss contraception. She has had the Nexplanon in for 5 years. She is having periods for about the last 2 years. She is with a new partner and she may want more children in the future.    Review of Systems Her pap smear is due. [place] at [employer] [place]  She had a difficult removal of her Mirena IUD. Objective:   Physical Exam [id number] Breathing, conversing, and ambulating normally Normal cervix, on her period, pap smear obtained  Consent was signed and time out was done. Her right arm was prepped with betadine after establishing the position of the Nexplanon. The area was infiltrated with 2 cc of 1% lidocaine. A small incision was made and the intact rod was easily removed. A new Nexplanon was placed according to package insert. A steristrip was placed and her arm was noted to be hemostatic. It was bandaged.  She tolerated the procedure well.       Assessment & Plan:  Contraception- Nexplanon Rec back up method for 2 weeks Preventative care- pap smear done today

## 2016-11-01 NOTE — Progress Notes (Signed)
Patient states that her Nexplanon has been in for five years.Armandina StammerJennifer Eyla Tallon

## 2016-11-03 LAB — CYTOLOGY - PAP
CHLAMYDIA, DNA PROBE: NEGATIVE
DIAGNOSIS: NEGATIVE
HPV: NOT DETECTED
NEISSERIA GONORRHEA: NEGATIVE

## 2017-02-20 ENCOUNTER — Encounter: Payer: Self-pay | Admitting: *Deleted

## 2017-02-20 ENCOUNTER — Encounter: Payer: Self-pay | Admitting: Obstetrics & Gynecology

## 2017-02-20 ENCOUNTER — Ambulatory Visit (INDEPENDENT_AMBULATORY_CARE_PROVIDER_SITE_OTHER): Payer: Medicaid Other | Admitting: Obstetrics & Gynecology

## 2017-02-20 VITALS — BP 136/95 | HR 74 | Resp 16 | Ht 63.0 in | Wt 139.0 lb

## 2017-02-20 DIAGNOSIS — Z3046 Encounter for surveillance of implantable subdermal contraceptive: Secondary | ICD-10-CM

## 2017-02-20 DIAGNOSIS — Z308 Encounter for other contraceptive management: Secondary | ICD-10-CM | POA: Diagnosis not present

## 2017-02-20 DIAGNOSIS — Z30011 Encounter for initial prescription of contraceptive pills: Secondary | ICD-10-CM

## 2017-02-20 MED ORDER — NORETHINDRONE ACET-ETHINYL EST 1-20 MG-MCG PO TABS: 1.0000 | ORAL_TABLET | Freq: Every day | ORAL | 11 refills | Status: AC

## 2017-02-20 NOTE — Patient Instructions (Signed)
Oral Contraception Information Oral contraceptive pills (OCPs) are medicines taken to prevent pregnancy. OCPs work by preventing the ovaries from releasing eggs. The hormones in OCPs also cause the cervical mucus to thicken, preventing the sperm from entering the uterus. The hormones also cause the uterine lining to become thin, not allowing a fertilized egg to attach to the inside of the uterus. OCPs are highly effective when taken exactly as prescribed. However, OCPs do not prevent sexually transmitted diseases (STDs). Safe sex practices, such as using condoms along with the pill, can help prevent STDs. Before taking the pill, you may have a physical exam and Pap test. Your health care provider may order blood tests. The health care provider will make sure you are a good candidate for oral contraception. Discuss with your health care provider the possible side effects of the OCP you may be prescribed. When starting an OCP, it can take 2 to 3 months for the body to adjust to the changes in hormone levels in your body. Types of oral contraception  The combination pill-This pill contains estrogen and progestin (synthetic progesterone) hormones. The combination pill comes in 21-day, 28-day, or 91-day packs. Some types of combination pills are meant to be taken continuously (365-day pills). With 21-day packs, you do not take pills for 7 days after the last pill. With 28-day packs, the pill is taken every day. The last 7 pills are without hormones. Certain types of pills have more than 21 hormone-containing pills. With 91-day packs, the first 84 pills contain both hormones, and the last 7 pills contain no hormones or contain estrogen only.  The minipill-This pill contains the progesterone hormone only. The pill is taken every day continuously. It is very important to take the pill at the same time each day. The minipill comes in packs of 28 pills. All 28 pills contain the hormone. Advantages of oral  contraceptive pills  Decreases premenstrual symptoms.  Treats menstrual period cramps.  Regulates the menstrual cycle.  Decreases a heavy menstrual flow.  May treatacne, depending on the type of pill.  Treats abnormal uterine bleeding.  Treats polycystic ovarian syndrome.  Treats endometriosis.  Can be used as emergency contraception. Things that can make oral contraceptive pills less effective OCPs can be less effective if:  You forget to take the pill at the same time every day.  You have a stomach or intestinal disease that lessens the absorption of the pill.  You take OCPs with other medicines that make OCPs less effective, such as antibiotics, certain HIV medicines, and some seizure medicines.  You take expired OCPs.  You forget to restart the pill on day 7, when using the packs of 21 pills.  Risks associated with oral contraceptive pills Oral contraceptive pills can sometimes cause side effects, such as:  Headache.  Nausea.  Breast tenderness.  Irregular bleeding or spotting.  Combination pills are also associated with a small increased risk of:  Blood clots.  Heart attack.  Stroke.  This information is not intended to replace advice given to you by your health care provider. Make sure you discuss any questions you have with your health care provider. Document Released: 10/22/2002 Document Revised: 01/07/2016 Document Reviewed: 01/20/2013 Elsevier Interactive Patient Education  2018 [employer]  

## 2017-02-20 NOTE — Progress Notes (Signed)
Pt had Nexplanon replaced in Mar. She reports that since that time she has had sx of depression and also anxiety. She reports that she did not have those sx on her last Nexplanon. She wants the Nexplanon removed and she wants to start OCPs.  She is a tobacco user and understands that the OCps will have to be changed in 3-4 years. She is considering getting pregnant again in 2 years. Pt denies prior h/o depression but, reports that her mother had a h/o depression.  Patient given informed consent, she signed consent form. She has gained >40# since her Nexplanon was placed.  She wants it removed.  Appropriate time out taken.  Patient's left arm was prepped and draped in the usual sterile fashion. Patient was prepped with alcohol swab and then injected with 3 ml of 1 % lidocaine.  She was prepped with betadine.  A #15 blade was used to make a small incision over the Nexplanon rod.  Using curved forceps, the end of the rod was grasped and the scar tissue was removed and the device was removed intact.  There was minimal blood loss. The incision site was covered with guaze and a pressure bandage to reduce any bruising.  The patient tolerated the procedure well and was given post procedure instructions.  Loestrin 1/20 q day. No contraindications to OCPs F/u in 3 months or sooner prn. REc f/u with a counselor if her sx of depression cont  Vasiliki Smaldone L. Harraway-Smith, M.D., [facility]
